# Patient Record
Sex: Male | Born: 1980 | Race: White | Hispanic: No | Marital: Married | Smoking: Never smoker
Health system: Southern US, Community
[De-identification: ages and names within clinical notes are randomized; demographics above are authoritative.]

---
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
## 2005-03-08 ENCOUNTER — Emergency Department (HOSPITAL_COMMUNITY): Admission: EM | Admit: 2005-03-08 | Discharge: 2005-03-08 | Payer: Self-pay | Admitting: Emergency Medicine

## 2006-01-18 ENCOUNTER — Ambulatory Visit: Payer: Self-pay | Admitting: Family Medicine

## 2011-03-10 ENCOUNTER — Ambulatory Visit
Admission: RE | Admit: 2011-03-10 | Discharge: 2011-03-10 | Disposition: A | Discharge status: Home / Self Care | Payer: PRIVATE HEALTH INSURANCE | Source: Ambulatory Visit | Attending: Occupational Medicine | Admitting: Occupational Medicine

## 2011-03-10 ENCOUNTER — Other Ambulatory Visit: Payer: Self-pay | Admitting: Occupational Medicine

## 2011-03-10 DIAGNOSIS — Z021 Encounter for pre-employment examination: Secondary | ICD-10-CM

## 2011-11-01 IMAGING — CR DG CHEST 1V
1 series · 1 of 1 positions shown · non-contrast
Comparison: None.

CLINICAL DATA: Pre employment physical

CHEST - 1 VIEW

[view not recorded]
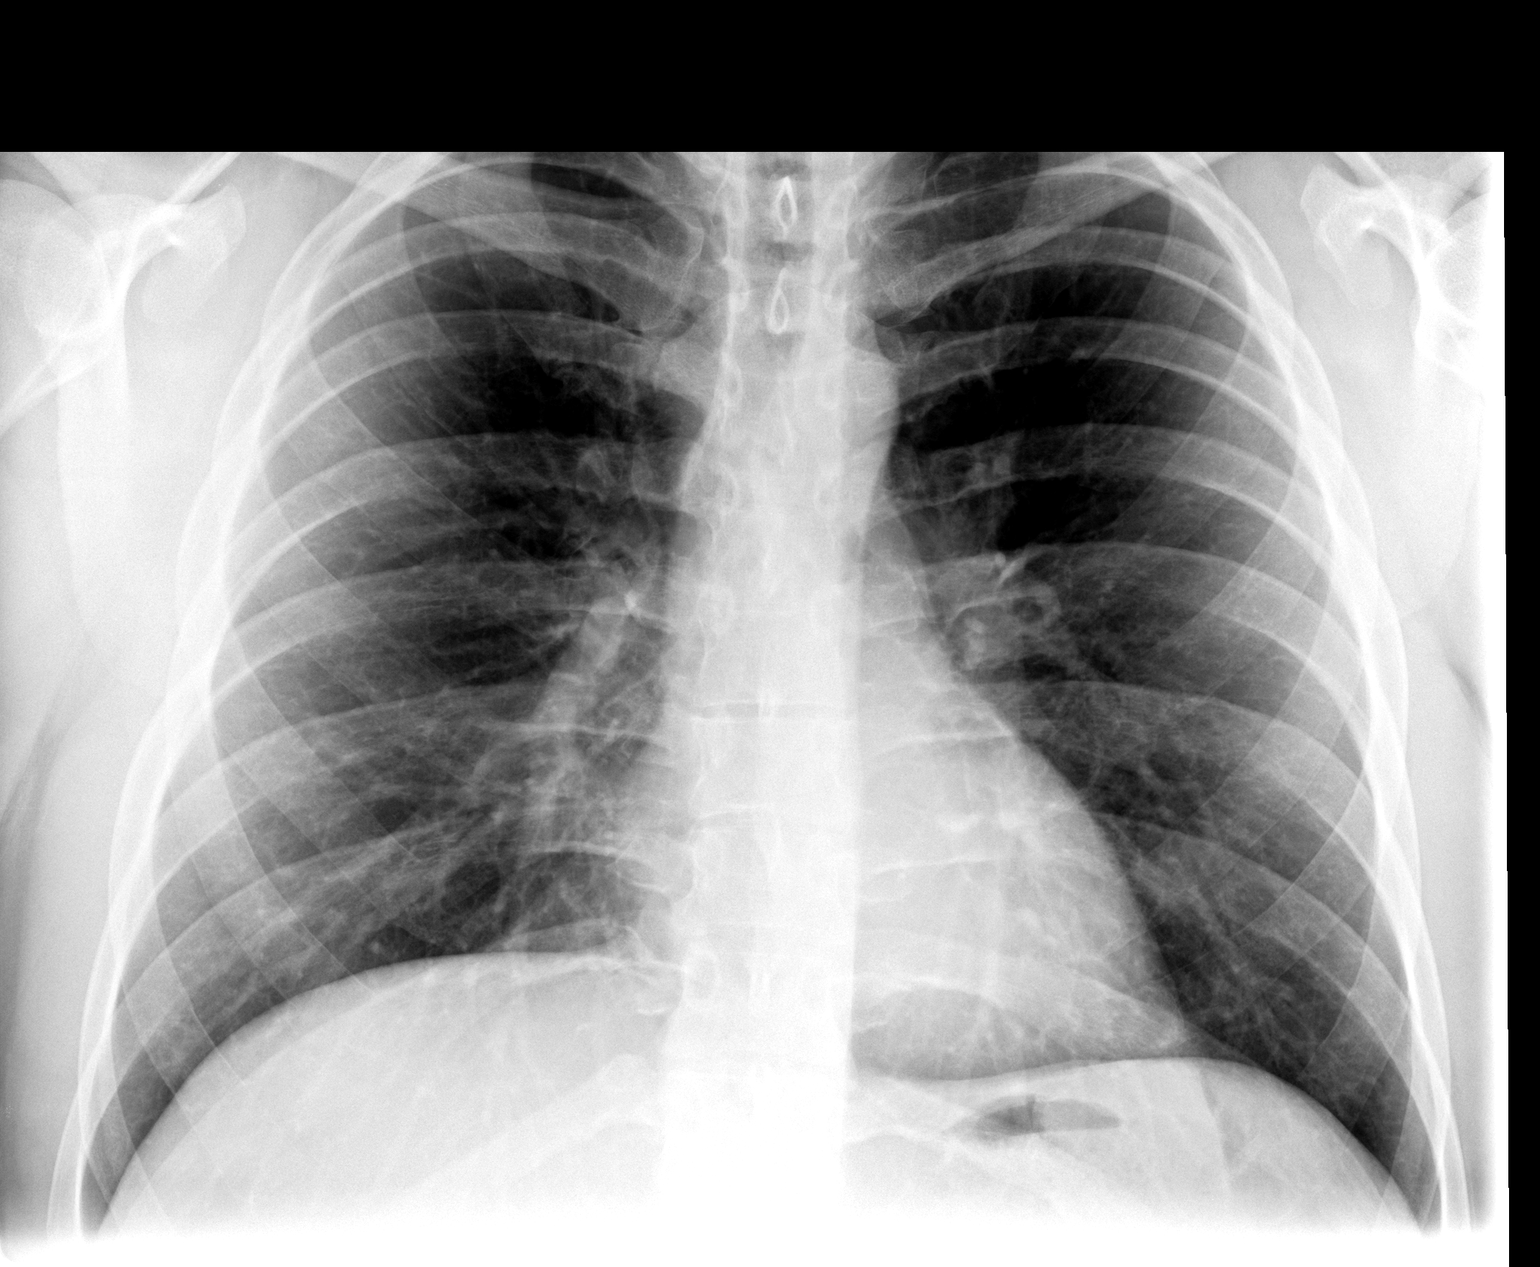

[1 of 1 positions shown; findings below may reference images not displayed]

FINDINGS: Costophrenic angles are omitted from the field of view.
Heart size is normal and the lungs are clear.  No osseous
abnormality.
IMPRESSION: Normal exam.

## 2012-08-21 ENCOUNTER — Ambulatory Visit (INDEPENDENT_AMBULATORY_CARE_PROVIDER_SITE_OTHER): Payer: 59 | Admitting: Family Medicine

## 2012-08-21 ENCOUNTER — Encounter: Payer: Self-pay | Admitting: Family Medicine

## 2012-08-21 VITALS — BP 118/82 | HR 84 | Temp 98.2°F | Ht 76.0 in | Wt 268.5 lb

## 2012-08-21 DIAGNOSIS — E669 Obesity, unspecified: Secondary | ICD-10-CM

## 2012-08-21 DIAGNOSIS — S76319A Strain of muscle, fascia and tendon of the posterior muscle group at thigh level, unspecified thigh, initial encounter: Secondary | ICD-10-CM | POA: Insufficient documentation

## 2012-08-21 DIAGNOSIS — IMO0002 Reserved for concepts with insufficient information to code with codable children: Secondary | ICD-10-CM

## 2012-08-21 NOTE — Progress Notes (Signed)
Subjective:    Patient ID: Brian Barton, male    DOB: 1981-10-11, 31 y.o.   MRN: [id number]  HPI CC: new pt to re establish  Tore piriformis muscle as getting back in shape, did have some L leg numbness.  Now completely resolved.  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 Body mass index is 32.68 kg/(m^2).  Preventative: CPE at work 2012.   Did have blood work then.  Told HDL a bit low. Flu shot - can get at work. 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  Past histories reviewed and updated if relevant as below. There is no problem list on file for this patient.  No past medical history on file. Past Surgical History  Procedure Date  . Pilonidal cyst excision 1998   History  Substance Use Topics  . Smoking status: Never Smoker   . Smokeless tobacco: Never Used  . Alcohol Use: Yes     Occasional   Family History  Problem Relation Age of Onset  . Cancer Maternal Grandmother 38    breast  . Stroke Paternal Grandfather 48  . CAD Neg Hx   . Diabetes Neg Hx   . Cancer Mother     basal   No Known Allergies No current outpatient prescriptions on file prior to visit.     Review of Systems  Constitutional: Negative for fever, chills, activity change, appetite change, fatigue and unexpected weight change.  HENT: Negative for hearing loss and neck pain.   Eyes: Negative for visual disturbance.  Respiratory: Negative for cough, chest tightness, shortness of breath and wheezing.   Cardiovascular: Negative for chest pain, palpitations and leg swelling.  Gastrointestinal: Negative for nausea, vomiting, abdominal pain, diarrhea, constipation, blood in stool and abdominal distention.  Genitourinary: Negative for  hematuria and difficulty urinating.  Musculoskeletal: Negative for myalgias and arthralgias.  Skin: Negative for rash.  Neurological: Negative for dizziness, seizures, syncope and headaches.  Hematological: Does not bruise/bleed easily.  Psychiatric/Behavioral: Negative for dysphoric mood. The patient is not nervous/anxious.        Objective:   Physical Exam  Nursing note and vitals reviewed. Constitutional: He is oriented to person, place, and time. He appears well-developed and well-nourished. No distress.  HENT:  Head: Normocephalic and atraumatic.  Right Ear: Hearing, tympanic membrane, external ear and ear canal normal.  Left Ear: Hearing, tympanic membrane, external ear and ear canal normal.  Nose: Nose normal.  Mouth/Throat: Oropharynx is clear and moist. No oropharyngeal exudate.  Eyes: Conjunctivae normal and EOM are normal. Pupils are equal, round, and reactive to light. No scleral icterus.  Neck: Normal range of motion. Neck supple.  Cardiovascular: Normal rate, regular rhythm, normal heart sounds and intact distal pulses.   No murmur heard. Pulses:      Radial pulses are 2+ on the right side, and 2+ on the left side.  Pulmonary/Chest: Effort normal and breath sounds normal. No respiratory distress. He has no wheezes. He has no rales.  Abdominal: Soft. Bowel sounds are normal. He exhibits no distension and no mass. There is no tenderness. There is no rebound and no guarding.  Musculoskeletal: Normal range of motion. He exhibits no edema.  Lymphadenopathy:    He has no cervical adenopathy.  Neurological: He  is alert and oriented to person, place, and time.       CN grossly intact, station and gait intact  Skin: Skin is warm and dry. No rash noted.  Psychiatric: He has a normal mood and affect. His behavior is normal. Judgment and thought content normal.       Assessment & Plan:

## 2012-08-21 NOTE — Assessment & Plan Note (Signed)
Technically BMI in obesity category, discussed goal weight loss.  Pt's goal is 240lbs, discussed healthier at 230.   Planning on starting to increase activity - discussed more cardio to increase HDL. Asked him to bring copy of last year's blood work to put in his chart. rtc 2-3 yrs for next CPE.

## 2012-08-21 NOTE — Patient Instructions (Signed)
Good to see you today, call us with questions. Come back as needed or in 2-3 years for a physical

## 2012-08-21 NOTE — Assessment & Plan Note (Signed)
Completely resolved. 

## 2013-10-14 ENCOUNTER — Ambulatory Visit (INDEPENDENT_AMBULATORY_CARE_PROVIDER_SITE_OTHER): Payer: 59 | Admitting: Internal Medicine

## 2013-10-14 ENCOUNTER — Encounter: Payer: Self-pay | Admitting: Internal Medicine

## 2013-10-14 VITALS — BP 118/80 | HR 73 | Temp 97.7°F | Ht 77.0 in | Wt 282.2 lb

## 2013-10-14 DIAGNOSIS — K121 Other forms of stomatitis: Secondary | ICD-10-CM

## 2013-10-14 DIAGNOSIS — K1370 Unspecified lesions of oral mucosa: Secondary | ICD-10-CM

## 2013-10-14 NOTE — Progress Notes (Signed)
   Subjective:    Patient ID: Brian Barton, male    DOB: Aug 10, 1981, 32 y.o.   MRN: [id number]  HPI  Pt presents to the clinic today with c/o a cut in his mouth. This occurred 1 week ago after getting hit in the face during a game of football. He doe note that it has gotten better. He is rinsing with a solution of 1/2 hydrogen peroxide and 1/2 water. He wants to just have it checked and make sure it is not infected.  Review of Systems      History reviewed. No pertinent past medical history.  No current outpatient prescriptions on file.   No current facility-administered medications for this visit.    No Known Allergies  Family History  Problem Relation Age of Onset  . Cancer Maternal Grandmother 35    breast  . Stroke Paternal Grandfather 43  . CAD Neg Hx   . Diabetes Neg Hx   . Cancer Mother     basal    History   Social History  . Marital Status: Married    Spouse Name: N/A    Number of Children: N/A  . Years of Education: N/A   Occupational History  . Not on file.   Social History Main Topics  . Smoking status: Never Smoker   . Smokeless tobacco: Never Used  . Alcohol Use: Yes     Comment: Occasional  . Drug Use: No  . Sexual Activity: Not on file   Other Topics Concern  . 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  HEENT: Denies eye pain, eye redness, ear pain, ringing in the ears, wax buildup, runny nose, nasal congestion, bloody nose, or sore throat.   No other specific complaints in a complete review of systems (except as listed in HPI above).  Objective:   Physical Exam  BP 118/80  Pulse 73  Temp(Src) 97.7 F (36.5  C) (Oral)  Ht 6\' 5"  (1.956 m)  Wt 282 lb 4 oz (128.028 kg)  BMI 33.46 kg/m2  SpO2 98% Wt Readings from Last 3 Encounters:  10/14/13 282 lb 4 oz (128.028 kg)  08/21/12 268 lb 8 oz (121.791 kg)    General: Appears his stated age, well developed, well nourished in NAD. HEENT: Head: normal shape and size; Eyes: sclera white, no icterus, conjunctiva pink, PERRLA and EOMs intact; Ears: Tm's gray and intact, normal light reflex; Nose: mucosa pink and moist, septum midline; Throat/Mouth: Teeth present, mucosa pink and moist, no exudate. Small 1 cm ulceration noted on lower lip, with evidence of healing.  Cardiovascular: Normal rate and rhythm. S1,S2 noted.  No murmur, rubs or gallops noted. No JVD or BLE edema. No carotid bruits noted. Pulmonary/Chest: Normal effort and positive vesicular breath sounds. No respiratory distress. No wheezes, rales or ronchi noted.           Assessment & Plan:   Mouth ulceration:  No evidence of infection at this time Rinse with salt water instead of hydrogen peroxide Pt declines lidocaine mouth rinse today  RTC as needed

## 2013-10-14 NOTE — Patient Instructions (Signed)
Oral Ulcers °Oral ulcers are painful, shallow sores around the lining of the mouth. They can affect the gums, the inside of the lips and the cheeks (sores on the outside of the lips and on the face are different). They typically first occur in school aged children and teenagers. Oral ulcers may also be called canker sores or cold sores. °CAUSES  °Canker sores and cold sores can be caused by many factors including: °· Infection. °· Injury. °· Sun exposure. °· Medications. °· Emotional stress. °· Food allergies. °· Vitamin deficiencies. °· Toothpastes containing sodium lauryl sulfate. °The Herpes Virus can be the cause of mouth ulcers. The first infection can be severe and cause 10 or more ulcers on the gums, tongue and lips with fever and difficulty in swallowing. This infection usually occurs between the ages of 1 and 3 years.  °SYMPTOMS  °The typical sore is about ¼ inch (6 mm) in size, is an oval or round ulcer with red borders. °DIAGNOSIS  °Your caregiver can diagnose simple oral ulcers by examination. Additional testing is usually not required.  °TREATMENT  °Treatment is aimed at pain relief. Generally, oral ulcers resolve by themselves within 1 to 2 weeks without medication and are not contagious unless caused by Herpes (and other viruses). Antibiotics are not effective with mouth sores. Avoid direct contact with others until the ulcer is completely healed. See your caregiver for follow-up care as recommended. Also: °· Offer a soft diet. °· Encourage plenty of fluids to prevent dehydration. Popsicles and milk shakes can be helpful. °· Avoid acidic and salty foods and drinks such as orange juice. °· Infants and young children will often refuse to drink because of pain. Using a teaspoon, cup or syringe to give small amounts of fluids frequently can help prevent dehydration. °· Cold compresses on the face may help reduce pain. °· Pain medication can help control soreness. °· A solution of diphenhydramine mixed  with a liquid antacid can be useful to decrease the soreness of ulcers. Consult a caregiver for the dosing. °· Liquids or ointments with a numbing ingredient may be helpful when used as recommended. °· Older children and teenagers can rinse their mouth with a salt-water mixture (1/2 teaspoonof salt in 8 ounces of water) four times a day. This treatment is uncomfortable but may reduce the time the ulcers are present. °· There are many over the counter throat lozenges and medications available for oral ulcers. There effectiveness has not been studied. °· Consult your medical caregiver prior to using homeopathic treatments for oral ulcers. °SEEK MEDICAL CARE IF:  °· You think your child needs to be seen. °· The pain worsens and you cannot control it. °· There are 4 or more ulcers. °· The lips and gums begin to bleed and crust. °· A single mouth ulcer is near a tooth that is causing a toothache or pain. °· Your child has a fever, swollen face, or swollen glands. °· The ulcers began after starting a medication. °· Mouth ulcers keep re-occurring or last more than 2 weeks. °· You think your child is not taking adequate fluids. °SEEK IMMEDIATE MEDICAL CARE IF:  °· Your child has a high fever. °· Your child is unable to swallow or becomes dehydrated. °· Your child looks or acts very ill. °· An ulcer caused by a chemical your child accidentally put in their mouth. °Document Released: 11/16/2004 Document Revised: 01/01/2012 Document Reviewed: 07/01/2009 °[employer]® Patient Information ©2014 [employer]. ° °

## 2013-10-14 NOTE — Progress Notes (Signed)
Pre-visit discussion using our clinic review tool. No additional management support is needed unless otherwise documented below in the visit note.  

## 2015-06-02 ENCOUNTER — Encounter (INDEPENDENT_AMBULATORY_CARE_PROVIDER_SITE_OTHER): Payer: Self-pay

## 2015-06-02 ENCOUNTER — Encounter: Payer: Self-pay | Admitting: Family Medicine

## 2015-06-02 ENCOUNTER — Ambulatory Visit (INDEPENDENT_AMBULATORY_CARE_PROVIDER_SITE_OTHER): Payer: Commercial Managed Care - HMO | Admitting: Family Medicine

## 2015-06-02 VITALS — BP 150/98 | HR 87 | Temp 98.2°F | Wt 283.5 lb

## 2015-06-02 DIAGNOSIS — M545 Low back pain: Secondary | ICD-10-CM | POA: Diagnosis not present

## 2015-06-02 MED ORDER — PREDNISONE 20 MG PO TABS: ORAL_TABLET | ORAL | Status: AC

## 2015-06-02 MED ORDER — TIZANIDINE HCL 4 MG PO TABS: 4.0000 mg | ORAL_TABLET | Freq: Every evening | ORAL | Status: AC

## 2015-06-02 NOTE — Progress Notes (Signed)
Pre visit review using our clinic review tool, if applicable. No additional management support is needed unless otherwise documented below in the visit note. 

## 2015-06-02 NOTE — Progress Notes (Signed)
Dr. Karleen Hampshire T. Lanell Dubie, MD, CAQ [facility] [address] [place], [address] Phone: [phone] Fax: [phone]  06/02/2015  Patient: Brian Barton, MRN: [id number], DOB: Apr 29, 1981, 34 y.o.  Primary Physician:  Eustaquio Boyden, MD  Chief Complaint: Hip Pain  Subjective:   Brian Barton is a 34 y.o. very pleasant male patient who presents with the following:  2 weeks ago, was having some pain in the lower back. No impact on day to day activities. Monday when woke up, pain a lot in the buttocks. Some lateral pain. He denies having any true groin pain.  He has had some occasional back pain off and on intermittently, but he is never really had any significant, severe back pain in the past.  He has never had any operations.  Standing up and walking is ok.  Yesterday even worse.   Tried some ice yesterday. Feels really deep. Tried some advil on Monday.  Right now he is uncomfortable and standing in the examination room.  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  Past Medical History, Surgical History, Social History, Family History, Problem List, Medications, and Allergies have been reviewed and updated if relevant.  Patient Active Problem List   Diagnosis Date Noted  . Obesity 08/21/2012    No past medical history on file.  Past Surgical History  Procedure Laterality Date  . Pilonidal cyst excision  1998    Social History   Social History  . Marital Status: Married    Spouse Name: N/A  . Number of Children: N/A  . Years of Education: N/A   Occupational History  . Not on file.   Social History Main Topics  . Smoking status: Never Smoker   . Smokeless tobacco: Never Used  . Alcohol Use: Yes     Comment: Occasional  . Drug Use: No  . Sexual Activity: Not on file   Other Topics Concern  . 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 Cancer Maternal Grandmother 33    breast  . Stroke Paternal Grandfather 4  . CAD Neg Hx   . Diabetes Neg Hx   . Cancer Mother     basal    No Known Allergies  Medication list reviewed and updated in full in [facility].   GEN: No acute illnesses, no fevers, chills. GI: No n/v/d, eating normally Pulm: No SOB Interactive and getting along well at home.  Otherwise, ROS is as per the HPI.  Objective:   BP 150/98 mmHg  Pulse 87  Temp(Src) 98.2 F (36.8 C) (Oral)  Wt 283 lb 8 oz (128.595 kg)  SpO2 97%   GEN: Well-developed,well-nourished,in no acute distress; alert,appropriate and cooperative throughout examination HEENT: Normocephalic and atraumatic without obvious abnormalities. Ears, externally no deformities PULM: Breathing comfortably in no respiratory distress EXT: No clubbing, cyanosis, or edema PSYCH: Normally interactive. Cooperative during the interview. Pleasant. Friendly and conversant. Not anxious or depressed appearing. Normal, full affect.  Range of motion at  the waist: Flexion, extension, lateral bending and rotation:  The patient is able to touch his knees, but he can go no  further.  4 flexion to approximately 75.  Extension is modestly limited.  Minor limitation in lateral bending.  No echymosis or edema Rises to examination table with mild difficulty Gait: minimally antalgic  Inspection/Deformity: N Paraspinus Tenderness: L3-S1 bilaterally.  B Ankle Dorsiflexion (L5,4): 5/5 B Great Toe Dorsiflexion (L5,4): 5/5 Heel Walk (L5): WNL Toe Walk (S1): WNL Rise/Squat (L4): WNL, mild pain  SENSORY B Medial Foot (L4): WNL B Dorsum (L5): WNL B Lateral (S1): WNL Light Touch: WNL Pinprick: WNL  REFLEXES Knee (L4): 2+ Ankle  (S1): 2+  B SLR, seated: neg B SLR, supine: neg B FABER: neg B Reverse FABER: some pain B Greater Troch: NT B Log Roll: neg B Stork: NT B Sciatic Notch: mild pain  Laboratory and Imaging Data:  Assessment and Plan:   Low back pain without sciatica, unspecified back pain laterality  Low back pain with referred buttocks pain.  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  Follow-up: if no improvement in 2-3 weeks  New Prescriptions   PREDNISONE (DELTASONE) 20 MG TABLET    2 tablets po for 5 days, then 1 tab po for 5 days   TIZANIDINE (ZANAFLEX) 4 MG TABLET    Take 1 tablet (4 mg total) by mouth Nightly.   No orders of the defined types were placed in this encounter.    Signed,  Elpidio Galea. Brielyn Bosak, MD   Patient's Medications  New Prescriptions   PREDNISONE (DELTASONE) 20 MG TABLET    2 tablets po for 5 days, then 1 tab po for 5 days   TIZANIDINE (ZANAFLEX) 4 MG TABLET    Take 1 tablet (4 mg total) by mouth Nightly.  Previous Medications   No medications on file  Modified Medications   No medications on file  Discontinued Medications   No medications on file
